# Patient Record
Sex: Female | Born: 1990 | Race: Black or African American | Hispanic: No | Marital: Single | Smoking: Former smoker
Health system: Southern US, Community
[De-identification: ages and names within clinical notes are randomized; demographics above are authoritative.]

---
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
## 2009-05-05 ENCOUNTER — Emergency Department: Payer: Self-pay | Admitting: Emergency Medicine

## 2010-07-29 ENCOUNTER — Emergency Department: Payer: Self-pay | Admitting: Unknown Physician Specialty

## 2010-08-02 ENCOUNTER — Emergency Department: Payer: Self-pay | Admitting: Emergency Medicine

## 2011-06-12 ENCOUNTER — Emergency Department: Payer: Self-pay | Admitting: Unknown Physician Specialty

## 2014-10-22 ENCOUNTER — Emergency Department
Admit: 2014-10-22 | Disposition: A | Discharge status: Home / Self Care | Payer: Self-pay | Admitting: Emergency Medicine

## 2015-10-09 ENCOUNTER — Encounter: Payer: Self-pay | Admitting: Emergency Medicine

## 2015-10-09 DIAGNOSIS — S51811A Laceration without foreign body of right forearm, initial encounter: Secondary | ICD-10-CM | POA: Insufficient documentation

## 2015-10-09 DIAGNOSIS — W260XXA Contact with knife, initial encounter: Secondary | ICD-10-CM | POA: Insufficient documentation

## 2015-10-09 DIAGNOSIS — Y998 Other external cause status: Secondary | ICD-10-CM | POA: Insufficient documentation

## 2015-10-09 DIAGNOSIS — Z87891 Personal history of nicotine dependence: Secondary | ICD-10-CM | POA: Insufficient documentation

## 2015-10-09 DIAGNOSIS — Z23 Encounter for immunization: Secondary | ICD-10-CM | POA: Insufficient documentation

## 2015-10-09 DIAGNOSIS — Y9389 Activity, other specified: Secondary | ICD-10-CM | POA: Insufficient documentation

## 2015-10-09 DIAGNOSIS — Y9289 Other specified places as the place of occurrence of the external cause: Secondary | ICD-10-CM | POA: Insufficient documentation

## 2015-10-09 NOTE — ED Notes (Signed)
Pt presents to ED with laceration to the top of her right arm. approx 6cm long with approximated edges. Bleeding not currently controlled; pressure dressing applied during triage. Pt states she was playing with a knife and accidentally cut her arm.

## 2015-10-09 NOTE — ED Notes (Signed)
Patient to stat desk via wheelchair.  Patient reports she was playing with a knife and it slipped down her arm.  Patient with large laceration to right forearm (approximately 4 inches).  Dressing applied, then immediately reinforced with more 4x4's.

## 2015-10-10 ENCOUNTER — Emergency Department
Admission: EM | Admit: 2015-10-10 | Discharge: 2015-10-10 | Disposition: A | Discharge status: Home / Self Care | Payer: Self-pay | Attending: Emergency Medicine | Admitting: Emergency Medicine

## 2015-10-10 DIAGNOSIS — S51811A Laceration without foreign body of right forearm, initial encounter: Secondary | ICD-10-CM

## 2015-10-10 MED ORDER — LIDOCAINE-EPINEPHRINE (PF) 1 %-1:200000 IJ SOLN
INTRAMUSCULAR | Status: AC
  Filled 2015-10-10: qty 30

## 2015-10-10 MED ORDER — LIDOCAINE HCL (PF) 1 % IJ SOLN: 30.0000 mL | Freq: Once | INTRAMUSCULAR | Status: DC

## 2015-10-10 MED ORDER — LIDOCAINE HCL (PF) 1 % IJ SOLN
INTRAMUSCULAR | Status: AC
  Filled 2015-10-10: qty 5

## 2015-10-10 MED ORDER — SODIUM BICARBONATE 8.4 % IV SOLN: 25.0000 meq | Freq: Once | INTRAVENOUS | Status: DC

## 2015-10-10 MED ORDER — CEFAZOLIN SODIUM-DEXTROSE 2-3 GM-% IV SOLR
2.0000 g | Freq: Once | INTRAVENOUS | Status: AC
  Administered 2015-10-10: 2 g via INTRAVENOUS
  Filled 2015-10-10: qty 50

## 2015-10-10 MED ORDER — BACITRACIN ZINC 500 UNIT/GM EX OINT
TOPICAL_OINTMENT | Freq: Two times a day (BID) | CUTANEOUS | Status: DC
  Administered 2015-10-10: 1 via TOPICAL

## 2015-10-10 MED ORDER — LIDOCAINE-EPINEPHRINE (PF) 2 %-1:200000 IJ SOLN: 20.0000 mL | Freq: Once | INTRAMUSCULAR | Status: DC

## 2015-10-10 MED ORDER — TETANUS-DIPHTH-ACELL PERTUSSIS 5-2.5-18.5 LF-MCG/0.5 IM SUSP
0.5000 mL | Freq: Once | INTRAMUSCULAR | Status: AC
  Administered 2015-10-10: 0.5 mL via INTRAMUSCULAR
  Filled 2015-10-10: qty 0.5

## 2015-10-10 MED ORDER — CEPHALEXIN 500 MG PO CAPS: 500.0000 mg | ORAL_CAPSULE | Freq: Four times a day (QID) | ORAL | Status: AC

## 2015-10-10 MED ORDER — BACITRACIN ZINC 500 UNIT/GM EX OINT
TOPICAL_OINTMENT | CUTANEOUS | Status: AC
  Filled 2015-10-10: qty 0.9

## 2015-10-10 NOTE — ED Provider Notes (Signed)
[facility] Emergency Department Provider Note  ____________________________________________   I have reviewed the triage vital signs and the nursing notes.   HISTORY  Chief Complaint Laceration    HPI Tami Waters is a 25 y.o. female states that she was "playing with" a razor knife and accidentally cut her arm. Witnesses with her state that this is what happened. Patient has no SI or HI and she adamantly denies doing this on purpose. She states that she was tossing the knife around and it went up her arm by accident. she adamantly denies assault. She denies any numbness or weakness.   History reviewed. No pertinent past medical history.  There are no active problems to display for this patient.   History reviewed. No pertinent past surgical history.  No current outpatient prescriptions on file.  Allergies Review of patient's allergies indicates no known allergies.  No family history on file.  Social History Social History  Substance Use Topics  . Smoking status: Former Games developermoker  . Smokeless tobacco: Never Used  . Alcohol Use: Yes    Review of Systems See history of present illness otherwise negative  ____________________________________________   PHYSICAL EXAM:  VITAL SIGNS: ED Triage Vitals  Enc Vitals Group     BP 10/09/15 2346 127/96 mmHg     Pulse Rate 10/09/15 2346 82     Resp 10/09/15 2346 16     Temp 10/09/15 2346 98.1 F (36.7 C)     Temp Source 10/09/15 2346 Oral     SpO2 10/09/15 2346 100 %     Weight 10/09/15 2346 295 lb (133.811 kg)     Height 10/09/15 2346 5\' 11"  (1.803 m)     Head Cir --      Peak Flow --      Pain Score 10/09/15 2346 8     Pain Loc --      Pain Edu? --      Excl. in GC? --     Constitutional: Alert and oriented. Well appearing and in no acute distress. Eyes: Conjunctivae are normal. PERRL. EOMI. Head: Atraumatic. Nose: No congestion/rhinnorhea. Mouth/Throat: Mucous  membranes are moist.  Oropharynx non-erythematous. Neck: No stridor.   Nontender with no meningismus Cardiovascular: Normal rate, regular rhythm. Grossly normal heart sounds.  Good peripheral circulation. Respiratory: Normal respiratory effort.  No retractions. Lungs CTAB. Abdominal: Soft and nontender. No distention. No guarding no rebound Back:  There is no focal tenderness or step off there is no midline tenderness there are no lesions noted. there is no CVA tenderness Musculoskeletal: No lower extremity tenderness. No joint effusions, no DVT signs strong distal pulses no edema, patient has a very deep long laceration, 16 cm to the dorsum of the left forearm from smoke to the wrist up towards the elbow. That does not involve any joint spaces. It does go deep into muscle. It is gaping open. There is a slow venous bleed noted. An sedation and strength intact in all nerve distributions of the hand radial ulnar and medial. Patient has intact flexion and extension of the wrist. she can flex and extend against resistance in all fingers in all distributions there is no foreign body noted or obvious tendon injury noted even when the wound is evaluated under all range of motion pains and fingers Neurologic:  Normal speech and language. No gross focal neurologic deficits are appreciated.  Skin:  See above Psychiatric: Mood and affect are normal. Speech and behavior are normal.  ____________________________________________   LABS (all labs ordered are listed, but only abnormal results are displayed)  Labs Reviewed - No data to display ____________________________________________  EKG  I personally interpreted any EKGs ordered by me or triage ____________________________________________  RADIOLOGY  I reviewed any imaging ordered by me or triage that were performed during my shift and, if possible, patient and/or family made aware of any abnormal  findings. ____________________________________________   PROCEDURES  Procedure(s) performed:  LACERATION REPAIR Performed by: Jeanmarie Plant Authorized by: Jeanmarie Plant Consent: Verbal consent obtained. Risks and benefits: risks, benefits and alternatives were discussed Consent given by: patient Patient identity confirmed: provided demographic data Prepped and Draped in normal sterile fashion Wound explored  Laceration Location: Right forearm  Laceration Length: 16 cm  No Foreign Bodies seen or palpated  Anesthesia: local infiltration  Local anesthetic: lidocaine 2% % with epinephrine, 10 cc diluted with sterile water as well as 5 cc of bicarbonate   Anesthetic total: 15 ml combined lidocaine and bicarbonate   Irrigation method: syringe Amount of cleaning: Significant 2 L under pressure the first with Betadine mixed in, the second sterile water   Skin closure: Deep sutures were used, 4 mattress sutures 0 gut to approximate the muscle, followed by 10 subcutaneous Rapide 4-0- sutures followed by 24 cutaneous interrupted and running sutures intermixed of 4-0 monofilament   Number of sutures: 34   Technique: See above,   Patient tolerance: Patient tolerated the procedure well with no immediate complications. No foreign bodies detected, no evidence of acute injury or laceration to tendons.   Critical Care performed: None  ____________________________________________   INITIAL IMPRESSION / ASSESSMENT AND PLAN / ED COURSE  Pertinent labs & imaging results that were available during my care of the patient were reviewed by me and considered in my medical decision making (see chart for details).  Patient with a very deep and, complicated laceration, this raises several issues. The first is causality. Patient adamantly denies any SI or HI and friends with her collaborate this. Patient denies any assault (with her collaborates this. She was showing off with a knife she  states and actually cut herself. She does not wish to talk to police. She feels safe at home. This was not a suicide attempt. Family and friends with her agreed to this evaluation. They state that there is no way she would try to hurt herself. Patient's affect is normal. I certainly cannot IVC this patient given the situation. I have expressed to her my concern about this injury and she states that she'll be much more careful in the future. The next issue was the injury itself. There is no evidence of acute tendon injury or foreign body. Nonetheless, I then extensively irrigated with 2 L of fluid under pressure, after numbing it up with lidocaine and bicarbonate. Sterile field obtained using chlorhexidine as well as Betadine. Then I did an extensive layered closure using 0 gut to bring the muscle together followed by 4-0 Rapide for subcutaneous closure followed by 4-0 interrupted and continuous sutures using monofilament to bring the wound together. Was able to use part of the tattoo to guide closure. Patient tolerated the procedure well. Giving her Ancef and we'll send her home antibiotics. Placing her in a splint. She'll follow-up with orthopedics. Return precautions and follow-up given and understood. Patient will follow up with orthopedic surgery for further evaluation of this deep cut and return precautions understood ____________________________________________   FINAL CLINICAL IMPRESSION(S) / ED DIAGNOSES  Final diagnoses:  None      This chart was dictated using voice recognition software.  Despite best efforts to proofread,  errors can occur which can change meaning.     Jeanmarie Plant, MD 10/10/15 [phone]

## 2015-10-10 NOTE — ED Notes (Signed)
MD at bedside. 

## 2015-10-10 NOTE — Discharge Instructions (Signed)
Laceration Care, Adult  A laceration is a cut that goes through all layers of the skin. The cut also goes into the tissue that is right under the skin. Some cuts heal on their own. Others need to be closed with stitches (sutures), staples, skin adhesive strips, or wound glue. Taking care of your cut lowers your risk of infection and helps your cut to heal better.  HOW TO TAKE CARE OF YOUR CUT  For stitches or staples:  · Keep the wound clean and dry.  · If you were given a bandage (dressing), you should change it at least one time per day or as told by your doctor. You should also change it if it gets wet or dirty.  · Keep the wound completely dry for the first 24 hours or as told by your doctor. After that time, you may take a shower or a bath. However, make sure that the wound is not soaked in water until after the stitches or staples have been removed.  · Clean the wound one time each day or as told by your doctor:    Wash the wound with soap and water.    Rinse the wound with water until all of the soap comes off.    Pat the wound dry with a clean towel. Do not rub the wound.  · After you clean the wound, put a thin layer of antibiotic ointment on it as told by your doctor. This ointment:    Helps to prevent infection.    Keeps the bandage from sticking to the wound.  · Have your stitches or staples removed as told by your doctor.  If your doctor used skin adhesive strips:   · Keep the wound clean and dry.  · If you were given a bandage, you should change it at least one time per day or as told by your doctor. You should also change it if it gets dirty or wet.  · Do not get the skin adhesive strips wet. You can take a shower or a bath, but be careful to keep the wound dry.  · If the wound gets wet, pat it dry with a clean towel. Do not rub the wound.  · Skin adhesive strips fall off on their own. You can trim the strips as the wound heals. Do not remove any strips that are still stuck to the wound. They will  fall off after a while.  If your doctor used wound glue:  · Try to keep your wound dry, but you may briefly wet it in the shower or bath. Do not soak the wound in water, such as by swimming.  · After you take a shower or a bath, gently pat the wound dry with a clean towel. Do not rub the wound.  · Do not do any activities that will make you really sweaty until the skin glue has fallen off on its own.  · Do not apply liquid, cream, or ointment medicine to your wound while the skin glue is still on.  · If you were given a bandage, you should change it at least one time per day or as told by your doctor. You should also change it if it gets dirty or wet.  · If a bandage is placed over the wound, do not let the tape for the bandage touch the skin glue.  · Do not pick at the glue. The skin glue usually stays on for 5-10 days. Then, it   falls off of the skin.  General Instructions   · To help prevent scarring, make sure to cover your wound with sunscreen whenever you are outside after stitches are removed, after adhesive strips are removed, or when wound glue stays in place and the wound is healed. Make sure to wear a sunscreen of at least 30 SPF.  · Take over-the-counter and prescription medicines only as told by your doctor.  · If you were given antibiotic medicine or ointment, take or apply it as told by your doctor. Do not stop using the antibiotic even if your wound is getting better.  · Do not scratch or pick at the wound.  · Keep all follow-up visits as told by your doctor. This is important.  · Check your wound every day for signs of infection. Watch for:    Redness, swelling, or pain.    Fluid, blood, or pus.  · Raise (elevate) the injured area above the level of your heart while you are sitting or lying down, if possible.  GET HELP IF:  · You got a tetanus shot and you have any of these problems at the injection site:    Swelling.    Very bad pain.    Redness.    Bleeding.  · You have a fever.  · A wound that was  closed breaks open.  · You notice a bad smell coming from your wound or your bandage.  · You notice something coming out of the wound, such as wood or glass.  · Medicine does not help your pain.  · You have more redness, swelling, or pain at the site of your wound.  · You have fluid, blood, or pus coming from your wound.  · You notice a change in the color of your skin near your wound.  · You need to change the bandage often because fluid, blood, or pus is coming from the wound.  · You start to have a new rash.  · You start to have numbness around the wound.  GET HELP RIGHT AWAY IF:  · You have very bad swelling around the wound.  · Your pain suddenly gets worse and is very bad.  · You notice painful lumps near the wound or on skin that is anywhere on your body.  · You have a red streak going away from your wound.  · The wound is on your hand or foot and you cannot move a finger or toe like you usually can.  · The wound is on your hand or foot and you notice that your fingers or toes look pale or bluish.     This information is not intended to replace advice given to you by your health care provider. Make sure you discuss any questions you have with your health care provider.     Document Released: 12/27/2007 Document Revised: 11/24/2014 Document Reviewed: 07/06/2014  Elsevier Interactive Patient Education ©2016 [employer]

## 2015-10-10 NOTE — ED Notes (Signed)
MD at bedside suturing nearly completed suturing patient

## 2015-10-13 ENCOUNTER — Emergency Department
Admission: EM | Admit: 2015-10-13 | Discharge: 2015-10-13 | Disposition: A | Discharge status: Home / Self Care | Payer: Self-pay | Attending: Emergency Medicine | Admitting: Emergency Medicine

## 2015-10-13 ENCOUNTER — Encounter: Payer: Self-pay | Admitting: Emergency Medicine

## 2015-10-13 DIAGNOSIS — Z792 Long term (current) use of antibiotics: Secondary | ICD-10-CM | POA: Insufficient documentation

## 2015-10-13 DIAGNOSIS — R209 Unspecified disturbances of skin sensation: Secondary | ICD-10-CM | POA: Insufficient documentation

## 2015-10-13 DIAGNOSIS — Z4800 Encounter for change or removal of nonsurgical wound dressing: Secondary | ICD-10-CM | POA: Insufficient documentation

## 2015-10-13 DIAGNOSIS — R52 Pain, unspecified: Secondary | ICD-10-CM | POA: Insufficient documentation

## 2015-10-13 DIAGNOSIS — R202 Paresthesia of skin: Secondary | ICD-10-CM

## 2015-10-13 DIAGNOSIS — Z87891 Personal history of nicotine dependence: Secondary | ICD-10-CM | POA: Insufficient documentation

## 2015-10-13 DIAGNOSIS — IMO0002 Reserved for concepts with insufficient information to code with codable children: Secondary | ICD-10-CM

## 2015-10-13 MED ORDER — NAPROXEN 500 MG PO TABS: 500.0000 mg | ORAL_TABLET | Freq: Two times a day (BID) | ORAL | Status: DC

## 2015-10-13 MED ORDER — TRAMADOL HCL 50 MG PO TABS
50.0000 mg | ORAL_TABLET | Freq: Four times a day (QID) | ORAL | Status: DC | PRN
Start: 2015-10-13 — End: 2016-01-15

## 2015-10-13 NOTE — ED Notes (Signed)
Pt seen on Saturday post laceration to right forearm, 34 stitches placed. Pt states she noticed swelling to right hand and tingling in the right pinky and ring finger. Pt was told on Saturday that if she noticed swelling to the affected area to report back to the ED.

## 2015-10-13 NOTE — ED Provider Notes (Signed)
[facility] Emergency Department Provider Note ____________________________________________  Time seen: Approximately 7:53 PM  I have reviewed the triage vital signs and the nursing notes.   HISTORY  Chief Complaint Wound Check   HPI Tami Waters is a 25 y.o. female who presents to the emergency department for evaluation of a laceration on her right forearm that was sutured here on 10/10/2015. She states that yesterday she began having a burning tingling sensation in the dorsal aspect of the ring and pinky finger on that right hand. She also noticed at that time some mild swelling of the hand. She has been taking the Keflex as prescribed. She has also been taking Tylenol for pain and she feels that this is insufficient and requests a prescription for something stronger.  History reviewed. No pertinent past medical history.  There are no active problems to display for this patient.   History reviewed. No pertinent past surgical history.  Current Outpatient Rx  Name  Route  Sig  Dispense  Refill  . cephALEXin (KEFLEX) 500 MG capsule   Oral   Take 1 capsule (500 mg total) by mouth 4 (four) times daily.   40 capsule   0   . naproxen (NAPROSYN) 500 MG tablet   Oral   Take 1 tablet (500 mg total) by mouth 2 (two) times daily with a meal.   60 tablet   0   . traMADol (ULTRAM) 50 MG tablet   Oral   Take 1 tablet (50 mg total) by mouth every 6 (six) hours as needed.   12 tablet   0     Allergies Review of patient's allergies indicates no known allergies.  No family history on file.  Social History Social History  Substance Use Topics  . Smoking status: Former Games developermoker  . Smokeless tobacco: Never Used  . Alcohol Use: Yes    Review of Systems Constitutional: No fever/chills  Musculoskeletal:Right forearm positive for tenderness due to laceration. Skin: Positive for sutured laceration on the right dorsal aspect of the  forearm. Neurological: Negative for headaches, focal weakness or numbness.   ____________________________________________   PHYSICAL EXAM:  VITAL SIGNS: ED Triage Vitals  Enc Vitals Group     BP 10/13/15 1836 125/92 mmHg     Pulse Rate 10/13/15 1836 87     Resp 10/13/15 1836 18     Temp 10/13/15 1836 98 F (36.7 C)     Temp Source 10/13/15 1836 Oral     SpO2 10/13/15 1836 100 %     Weight 10/13/15 1836 295 lb (133.811 kg)     Height 10/13/15 1836 5\' 11"  (1.803 m)     Head Cir --      Peak Flow --      Pain Score 10/13/15 1838 9     Pain Loc --      Pain Edu? --      Excl. in GC? --     Constitutional: Alert and oriented. Well appearing and in no acute distress. Eyes: Conjunctivae are normal. Head: Atraumatic. Cardiovascular:Good peripheral circulation. Respiratory: Normal respiratory effort.  No retractions.  Musculoskeletal: Full ROM throughout. Neurologic:  Normal speech and language. No gross focal neurologic deficits are appreciated. No sensation discrepancy between sharp and dull of the ring and pinky finger of the right hand. Patient is able to touch thumb to each finger on the right hand without difficulty or pain. Speech is normal. No gait instability. Skin: Well approximated laceration noted to the  dorsal aspect of the right forearm that appears to be healing as expected without erythema or drainage.   Psychiatric: Mood and affect are normal. Speech and behavior are normal.  ____________________________________________   LABS (all labs ordered are listed, but only abnormal results are displayed)  Labs Reviewed - No data to display ____________________________________________  RADIOLOGY  Not indicated ____________________________________________   PROCEDURES  Procedure(s) performed: Not indicated  ___________________________________________   INITIAL IMPRESSION / ASSESSMENT AND PLAN / ED COURSE  Pertinent labs & imaging results that were available  during my care of the patient were reviewed by me and considered in my medical decision making (see chart for details).  Patient was advised to follow up with urgent care or primary care as previously discussed when sutures were inserted. She was advised that she will need to follow-up with a hand specialist or orthopedics if paresthesias continue. She was advised to return to the emergency department for symptoms that change or worsen if she is unable to schedule an appointment. ____________________________________________   FINAL CLINICAL IMPRESSION(S) / ED DIAGNOSES  Final diagnoses:  Encounter for re-check of laceration wound  Paresthesias in right hand  Pain from laceration       Chinita Pester, FNP 10/13/15 1610  Maurilio Lovely, MD 10/14/15 [phone]

## 2015-10-13 NOTE — ED Notes (Signed)
Pt got sutured here on 10/10/15 on her right arm, states pain and tingling in her fingers began last pm.

## 2016-01-14 ENCOUNTER — Other Ambulatory Visit: Payer: Self-pay

## 2016-01-14 ENCOUNTER — Emergency Department: Payer: Self-pay

## 2016-01-14 DIAGNOSIS — R0789 Other chest pain: Secondary | ICD-10-CM | POA: Insufficient documentation

## 2016-01-14 DIAGNOSIS — Z5321 Procedure and treatment not carried out due to patient leaving prior to being seen by health care provider: Secondary | ICD-10-CM | POA: Insufficient documentation

## 2016-01-14 NOTE — ED Notes (Signed)
Patient reports mid upper chest tightness and feeling short of breath and upper back pain.  Patient also reports she felt dizzy.

## 2016-01-15 ENCOUNTER — Emergency Department
Admission: EM | Admit: 2016-01-15 | Discharge: 2016-01-15 | Disposition: A | Discharge status: Home / Self Care | Payer: Self-pay | Attending: Emergency Medicine | Admitting: Emergency Medicine

## 2016-01-15 LAB — CBC
HEMATOCRIT: 36.4 % (ref 35.0–47.0)
HEMOGLOBIN: 12.1 g/dL (ref 12.0–16.0)
MCH: 25.7 pg — ABNORMAL LOW (ref 26.0–34.0)
MCHC: 33.2 g/dL (ref 32.0–36.0)
MCV: 77.4 fL — AB (ref 80.0–100.0)
Platelets: 223 10*3/uL (ref 150–440)
RBC: 4.71 MIL/uL (ref 3.80–5.20)
RDW: 15.3 % — ABNORMAL HIGH (ref 11.5–14.5)
WBC: 10 10*3/uL (ref 3.6–11.0)

## 2016-01-15 LAB — BASIC METABOLIC PANEL
Anion gap: 7 (ref 5–15)
BUN: 12 mg/dL (ref 6–20)
CHLORIDE: 108 mmol/L (ref 101–111)
CO2: 24 mmol/L (ref 22–32)
Calcium: 8.9 mg/dL (ref 8.9–10.3)
Creatinine, Ser: 0.92 mg/dL (ref 0.44–1.00)
GFR calc non Af Amer: >60 mL/min (ref >60)
Glucose, Bld: 111 mg/dL — ABNORMAL HIGH (ref 65–99)
POTASSIUM: 3.3 mmol/L — AB (ref 3.5–5.1)
SODIUM: 139 mmol/L (ref 135–145)

## 2016-01-15 LAB — TROPONIN I: Troponin I: <0.03 ng/mL (ref <0.031)

## 2017-06-11 ENCOUNTER — Other Ambulatory Visit: Payer: Self-pay

## 2017-06-11 ENCOUNTER — Emergency Department: Payer: Self-pay

## 2017-06-11 ENCOUNTER — Emergency Department
Admission: EM | Admit: 2017-06-11 | Discharge: 2017-06-11 | Disposition: A | Discharge status: Home / Self Care | Payer: Self-pay | Attending: Emergency Medicine | Admitting: Emergency Medicine

## 2017-06-11 DIAGNOSIS — Y9301 Activity, walking, marching and hiking: Secondary | ICD-10-CM | POA: Insufficient documentation

## 2017-06-11 DIAGNOSIS — Y999 Unspecified external cause status: Secondary | ICD-10-CM | POA: Insufficient documentation

## 2017-06-11 DIAGNOSIS — W2201XA Walked into wall, initial encounter: Secondary | ICD-10-CM | POA: Insufficient documentation

## 2017-06-11 DIAGNOSIS — Z87891 Personal history of nicotine dependence: Secondary | ICD-10-CM | POA: Insufficient documentation

## 2017-06-11 DIAGNOSIS — S90121A Contusion of right lesser toe(s) without damage to nail, initial encounter: Secondary | ICD-10-CM | POA: Insufficient documentation

## 2017-06-11 DIAGNOSIS — Y929 Unspecified place or not applicable: Secondary | ICD-10-CM | POA: Insufficient documentation

## 2017-06-11 MED ORDER — IBUPROFEN 800 MG PO TABS
800.0000 mg | ORAL_TABLET | Freq: Once | ORAL | Status: AC
  Administered 2017-06-11: 800 mg via ORAL
  Filled 2017-06-11: qty 1

## 2017-06-11 MED ORDER — IBUPROFEN 800 MG PO TABS: 800.0000 mg | ORAL_TABLET | Freq: Three times a day (TID) | ORAL | 0 refills | Status: DC | PRN

## 2017-06-11 NOTE — ED Triage Notes (Addendum)
Pt presents to ED via POV with c/o RIGHT foot pain. Pt reports kicking the wall on accident while running through the house playing with some kids. Pt has redness and swelling to foot; no obvious deformity or dislocation; CMS intact.

## 2017-06-11 NOTE — ED Provider Notes (Signed)
[facility] Emergency Department Provider Note ____________________________________________  Time seen: 2237  I have reviewed the triage vital signs and the nursing notes.  HISTORY  Chief Complaint  Foot Pain  HPI Tami Waters is a 26 y.o. female presented herself to the ED for evaluation of pain to the right foot.  She describes pain primarily to the right fourth toes.  She describes accidentally kicking a wall, related to house barefoot.  She denies any head injury, loss of consciousness, with him at this time but she presents with some pain and mild redness to the right fourth toe.  No nail injury or deformity is reported.  History reviewed. No pertinent past medical history.  There are no active problems to display for this patient.  History reviewed. No pertinent surgical history.  Prior to Admission medications   Medication Sig Start Date End Date Taking? Authorizing Provider  ibuprofen (ADVIL,MOTRIN) 800 MG tablet Take 1 tablet (800 mg total) every 8 (eight) hours as needed by mouth. 06/11/17   Raymel Cull, Charlesetta IvoryJenise V Bacon, PA-C    Allergies Patient has no known allergies.  No family history on file.  Social History Social History   Tobacco Use  . Smoking status: Former Games developermoker  . Smokeless tobacco: Never Used  Substance Use Topics  . Alcohol use: Yes  . Drug use: No    Review of Systems  Constitutional: Negative for fever.  Cardiovascular: Negative for chest pain. Respiratory: Negative for shortness of breath. Musculoskeletal: Negative for back pain. Right toe pain as above Skin: Negative for rash. Neurological: Negative for headaches, focal weakness or numbness. ____________________________________________  PHYSICAL EXAM:  VITAL SIGNS: ED Triage Vitals  Enc Vitals Group     BP 06/11/17 2138 129/67     Pulse Rate 06/11/17 2138 86     Resp 06/11/17 2138 16     Temp 06/11/17 2138 98.5 F (36.9 C)     Temp Source 06/11/17  2138 Oral     SpO2 06/11/17 2138 99 %     Weight 06/11/17 2136 256 lb (116.1 kg)     Height 06/11/17 2136 5\' 10"  (1.778 m)     Head Circumference --      Peak Flow --      Pain Score 06/11/17 2136 10     Pain Loc --      Pain Edu? --      Excl. in GC? --     Constitutional: Alert and oriented. Well appearing and in no distress. Head: Normocephalic and atraumatic. Cardiovascular:  Normal distal pulses and cap refill. Respiratory: Normal respiratory effort. No wheezes/rales/rhonchi. Musculoskeletal: Right foot without deformity, dislocation or edema. Mild ecchymosis noted to the right 4th toe DIP. Normal toe ROM. Nontender with normal range of motion in all extremities.  Neurologic:  Antalgic gait without ataxia. Normal speech and language. No gross focal neurologic deficits are appreciated. Skin:  Skin is warm, dry and intact. No rash noted. ____________________________________________   RADIOLOGY  Right Foot IMPRESSION: Negative.  I, Isamar Wellbrock, Charlesetta IvoryJenise V Bacon, personally viewed and evaluated these images (plain radiographs) as part of my medical decision making, as well as reviewing the written report by the radiologist. ____________________________________________  PROCEDURES  Procedures IBU 800 mg PO Post-op shoe ____________________________________________  INITIAL IMPRESSION / ASSESSMENT AND PLAN / ED COURSE  ED evaluation of acute pain to the right foot specifically the right toe.  X-rays negative for any acute fracture dislocation.  She is placed in a postop shoe  after the toes are buddy taped.  A prescription for ibuprofen is provided and she is referred to podiatry for ongoing symptoms. A work note is provided for tomorrow is provided as requested. ____________________________________________  FINAL CLINICAL IMPRESSION(S) / ED DIAGNOSES  Final diagnoses:  Contusion of fourth toe of right foot, initial encounter      Lissa HoardMenshew, Anuhea Gassner V Bacon, PA-C 06/11/17  2343    Sharman CheekStafford, Phillip, MD 06/12/17 [phone]

## 2017-06-11 NOTE — Discharge Instructions (Signed)
You have a contusion to the 4th toe. Wear the buddy tape as needed. Apply ice to reduce pain and swelling. Follow-up with Dr. Ether GriffinsFowler as needed.

## 2017-06-11 NOTE — ED Notes (Signed)
Pt ambulatory upon discharge. Verbalized understanding of discharge instructions, prescription and follow-up care. Pt A&O x4. Skin warm and dry.

## 2017-06-11 NOTE — ED Notes (Signed)
Pt states she was playing with the kids when she accidentally ran into the wall. Happened a few hours ago but she states it's getting worse.

## 2017-11-03 ENCOUNTER — Emergency Department (HOSPITAL_COMMUNITY)
Admission: EM | Admit: 2017-11-03 | Discharge: 2017-11-03 | Disposition: A | Discharge status: Home / Self Care | Payer: Self-pay | Attending: Emergency Medicine | Admitting: Emergency Medicine

## 2017-11-03 ENCOUNTER — Other Ambulatory Visit: Payer: Self-pay

## 2017-11-03 ENCOUNTER — Encounter (HOSPITAL_COMMUNITY): Payer: Self-pay | Admitting: Emergency Medicine

## 2017-11-03 ENCOUNTER — Emergency Department (HOSPITAL_COMMUNITY): Payer: Self-pay

## 2017-11-03 DIAGNOSIS — Z87891 Personal history of nicotine dependence: Secondary | ICD-10-CM | POA: Insufficient documentation

## 2017-11-03 DIAGNOSIS — Y999 Unspecified external cause status: Secondary | ICD-10-CM | POA: Insufficient documentation

## 2017-11-03 DIAGNOSIS — Y92009 Unspecified place in unspecified non-institutional (private) residence as the place of occurrence of the external cause: Secondary | ICD-10-CM | POA: Insufficient documentation

## 2017-11-03 DIAGNOSIS — Y93E5 Activity, floor mopping and cleaning: Secondary | ICD-10-CM | POA: Insufficient documentation

## 2017-11-03 DIAGNOSIS — W260XXA Contact with knife, initial encounter: Secondary | ICD-10-CM | POA: Insufficient documentation

## 2017-11-03 DIAGNOSIS — S61239A Puncture wound without foreign body of unspecified finger without damage to nail, initial encounter: Secondary | ICD-10-CM

## 2017-11-03 DIAGNOSIS — S61031A Puncture wound without foreign body of right thumb without damage to nail, initial encounter: Secondary | ICD-10-CM | POA: Insufficient documentation

## 2017-11-03 MED ORDER — CEPHALEXIN 500 MG PO CAPS: 500.0000 mg | ORAL_CAPSULE | Freq: Four times a day (QID) | ORAL | 0 refills | Status: DC

## 2017-11-03 NOTE — ED Provider Notes (Signed)
[facility] EMERGENCY DEPARTMENT Provider Note   CSN: [id number] Arrival date & time: 11/03/17  2056     History   Chief Complaint Chief Complaint  Patient presents with  . Puncture    HPI Tami Waters is a 27 y.o. female.  Tami Waters is a 27 y.o. Female who is otherwise healthy, presents to the ED for evaluation of laceration to the base of the right thumb just prior to arrival.  Patient reports she was cleaning her hair off of her vacuum with an X-Acto knife and it slipped and she stabbed down at the base of her right thumb.  Patient reports some bleeding.  She wash the area out with water and hydrogen peroxide prior to arrival.  Patient reports pain and limited range of motion, no erythema or swelling of the joint.  No numbness or tingling.  Tetanus updated 2 years ago.     History reviewed. No pertinent past medical history.  There are no active problems to display for this patient.   History reviewed. No pertinent surgical history.   OB History   None      Home Medications    Prior to Admission medications   Medication Sig Start Date End Date Taking? Authorizing Provider  ibuprofen (ADVIL,MOTRIN) 800 MG tablet Take 1 tablet (800 mg total) every 8 (eight) hours as needed by mouth. 06/11/17   Menshew, Charlesetta Ivory, PA-C    Family History No family history on file.  Social History Social History   Tobacco Use  . Smoking status: Former Games developer  . Smokeless tobacco: Never Used  Substance Use Topics  . Alcohol use: Yes  . Drug use: No     Allergies   Patient has no known allergies.   Review of Systems Review of Systems  Constitutional: Negative for chills and unexpected weight change.  Musculoskeletal: Positive for arthralgias.  Skin: Positive for wound.  Neurological: Negative for weakness and numbness.     Physical Exam Updated Vital Signs BP (!) 130/91 (BP Location: Right Arm)   Pulse 80   Temp 97.8  F (36.6 C) (Oral)   Resp 20   Ht 5\' 11"  (1.803 m)   Wt 116.1 kg (256 lb)   LMP 10/22/2017   SpO2 100%   BMI 35.70 kg/m   Physical Exam  Constitutional: She appears well-developed and well-nourished. No distress.  HENT:  Head: Normocephalic and atraumatic.  Eyes: Right eye exhibits no discharge. Left eye exhibits no discharge.  Pulmonary/Chest: Effort normal. No respiratory distress.  Musculoskeletal:       Hands: 0.5 cm stab laceration at the base of the right thumb, small amount of bleeding present.  Range of motion limited by pain but patient is able to move the thumb in all directions.  No overlying erythema, warmth or swelling of the MCP joint.  2+ radial pulse and good capillary refill of the thumb, sensation intact.  Neurological: She is alert. Coordination normal.  Skin: Skin is warm and dry. She is not diaphoretic.  Psychiatric: She has a normal mood and affect. Her behavior is normal.  Nursing note and vitals reviewed.    ED Treatments / Results  Labs (all labs ordered are listed, but only abnormal results are displayed) Labs Reviewed - No data to display  EKG None  Radiology Dg Finger Thumb Right  Result Date: 11/03/2017 CLINICAL DATA:  Lateral thumb laceration. EXAM: RIGHT THUMB 2+V COMPARISON:  None. FINDINGS: There is no evidence of  fracture or dislocation. There is no evidence of arthropathy or other focal bone abnormality. Soft tissues are unremarkable IMPRESSION: Negative. Electronically Signed   By: Awilda Metroourtnay  Bloomer M.D.   On: 11/03/2017 22:11    Procedures Procedures (including critical care time)  Medications Ordered in ED Medications - No data to display   Initial Impression / Assessment and Plan / ED Course  I have reviewed the triage vital signs and the nursing notes.  Pertinent labs & imaging results that were available during my care of the patient were reviewed by me and considered in my medical decision making (see chart for  details).  Patient presents to the ED for evaluation of 0.5 cm stab laceration at the base of the right thumb.  Tetanus is up-to-date.  Patient reports increased pain with range of motion, bleeding is controlled.  Thumb is neurovascularly intact.  X-ray shows no evidence of fracture, arthropathy, or bony abnormality.  It is very unlikely that patient stabbed directly into the joint space, but as a precaution the area was irrigated with normal saline, dressed with bacitracin and adhesive bandage, and patient will be placed on Keflex for infection prophylaxis.  Will not closed wound with stitches as it is well approximated and very small.  Strict return precautions discussed.  Patient requests his brace for support, patient placed in Velcro thumb spica, but counseled that she will need to remove the brace at least twice a day and assess the skin for any signs of infection.  Patient expresses understanding and is in agreement with plan.  Final Clinical Impressions(s) / ED Diagnoses   Final diagnoses:  Puncture wound of finger of right hand, initial encounter    ED Discharge Orders        Ordered    cephALEXin (KEFLEX) 500 MG capsule  4 times daily     11/03/17 2315       Dartha LodgeFord, Daegon Deiss N, PA-C 11/04/17 [id number]    Shaune PollackIsaacs, Cameron, MD 11/04/17 1110

## 2017-11-03 NOTE — ED Triage Notes (Signed)
Pt states while working on a vacuum cleaner she accidentally stabbed herself with and exacto knife in R MP Joint, minimal drainage noted, pt experiencing pain and limited ROM. Small laceration noted

## 2017-11-03 NOTE — Discharge Instructions (Signed)
Please take antibiotics as directed.  Keep area clean and dry you may shower but do not submerge her hand under water, use antibiotic ointment and bandages.  Please follow-up with the [facility].  If you have increasing drainage from the wound, redness, swelling, warmth or any other new or concerning symptoms return to the ED for reevaluation.

## 2018-02-19 ENCOUNTER — Emergency Department
Admission: EM | Admit: 2018-02-19 | Discharge: 2018-02-19 | Disposition: A | Discharge status: Home / Self Care | Payer: Self-pay | Attending: Emergency Medicine | Admitting: Emergency Medicine

## 2018-02-19 ENCOUNTER — Other Ambulatory Visit: Payer: Self-pay

## 2018-02-19 DIAGNOSIS — Z87891 Personal history of nicotine dependence: Secondary | ICD-10-CM | POA: Insufficient documentation

## 2018-02-19 DIAGNOSIS — H1089 Other conjunctivitis: Secondary | ICD-10-CM | POA: Insufficient documentation

## 2018-02-19 DIAGNOSIS — H109 Unspecified conjunctivitis: Secondary | ICD-10-CM

## 2018-02-19 MED ORDER — CIPROFLOXACIN HCL 0.3 % OP SOLN
OPHTHALMIC | Status: AC
  Filled 2018-02-19: qty 2.5

## 2018-02-19 MED ORDER — CIPROFLOXACIN HCL 0.3 % OP SOLN
2.0000 [drp] | OPHTHALMIC | Status: DC
  Administered 2018-02-19: 2 [drp] via OPHTHALMIC
  Filled 2018-02-19: qty 2.5

## 2018-02-19 NOTE — ED Notes (Signed)
Pt c/o redness and drainage to her right eye for the past 2-3 days.

## 2018-02-19 NOTE — ED Triage Notes (Signed)
Pt arrives to ED via POV from home with c/o conjunctivitis to the right eye x3 days. Pt reports clear/yellow drainage.

## 2018-02-19 NOTE — ED Provider Notes (Signed)
[facility] Emergency Department Provider Note   First MD Initiated Contact with Patient 02/19/18 0134     (approximate)  I have reviewed the triage vital signs and the nursing notes.   HISTORY  Chief Complaint Conjunctivitis    HPI Tami Waters is a 27 y.o. female presents with 3-day history of right eye redness yellowish drainage, eyelash matting on awakening.  Patient denies any fever no cough.  Patient any change in vision.  Past medical history None she There are no active problems to display for this patient.   Surgical history None  Prior to Admission medications   Medication Sig Start Date End Date Taking? Authorizing Provider  cephALEXin (KEFLEX) 500 MG capsule Take 1 capsule (500 mg total) by mouth 4 (four) times daily. 11/03/17   Dartha Lodge, PA-C  ibuprofen (ADVIL,MOTRIN) 800 MG tablet Take 1 tablet (800 mg total) every 8 (eight) hours as needed by mouth. 06/11/17   Menshew, Charlesetta Ivory, PA-C    Allergies No known drug allergies No family history on file.  Social History Social History   Tobacco Use  . Smoking status: Former Games developer  . Smokeless tobacco: Never Used  Substance Use Topics  . Alcohol use: Yes  . Drug use: No    Review of Systems Constitutional: No fever/chills Eyes: No visual changes.  Positive for right eye redness and drainage ENT: No sore throat. Cardiovascular: Denies chest pain. Respiratory: Denies shortness of breath. Gastrointestinal: No abdominal pain.  No nausea, no vomiting.  No diarrhea.  No constipation. Genitourinary: Negative for dysuria. Musculoskeletal: Negative for neck pain.  Negative for back pain. Integumentary: Negative for rash. Neurological: Negative for headaches, focal weakness or numbness.  ____________________________________________   PHYSICAL EXAM:  VITAL SIGNS: ED Triage Vitals  Enc Vitals Group     BP 02/19/18 0059 118/78     Pulse Rate 02/19/18 0059 83       Resp 02/19/18 0059 17     Temp 02/19/18 0059 98.3 F (36.8 C)     Temp Source 02/19/18 0059 Oral     SpO2 02/19/18 0059 99 %     Weight 02/19/18 0057 124.7 kg (275 lb)     Height 02/19/18 0057 1.803 m (5\' 11" )     Head Circumference --      Peak Flow --      Pain Score 02/19/18 0057 8     Pain Loc --      Pain Edu? --      Excl. in GC? --     Constitutional: Alert and oriented. Well appearing and in no acute distress. Eyes: Conjunctivae are erythema with yellowish exudate.   Head: Atraumatic. Mouth/Throat: Mucous membranes are moist.  Oropharynx non-erythematous. Neck: No stridor.   Neurologic:  Normal speech and language. No gross focal neurologic deficits are appreciated.  Skin:  Skin is warm, dry and intact. No rash noted. Psychiatric: Mood and affect are normal. Speech and behavior are normal.        Procedures   ____________________________________________   INITIAL IMPRESSION / ASSESSMENT AND PLAN / ED COURSE  As part of my medical decision making, I reviewed the following data within the electronic MEDICAL RECORD NUMBER   28 year old female presented with above-stated history and physical exam concerning for bacterial conjunctivitis.  As such patient given Cipro ophthalmic will be prescribed the same for home. ____________________________________________  FINAL CLINICAL IMPRESSION(S) / ED DIAGNOSES  Final diagnoses:  Bacterial conjunctivitis of right eye  MEDICATIONS GIVEN DURING THIS VISIT:  Medications  ciprofloxacin (CILOXAN) 0.3 % ophthalmic solution 2 drop (has no administration in time range)     ED Discharge Orders    None       Note:  This document was prepared using Dragon voice recognition software and may include unintentional dictation errors.    Darci CurrentBrown, [place] N, MD 02/19/18 [phone]

## 2018-04-03 ENCOUNTER — Other Ambulatory Visit: Payer: Self-pay

## 2018-04-03 ENCOUNTER — Encounter: Payer: Self-pay | Admitting: Emergency Medicine

## 2018-04-03 DIAGNOSIS — N764 Abscess of vulva: Secondary | ICD-10-CM | POA: Insufficient documentation

## 2018-04-03 DIAGNOSIS — Z87891 Personal history of nicotine dependence: Secondary | ICD-10-CM | POA: Insufficient documentation

## 2018-04-03 NOTE — ED Triage Notes (Signed)
Patient ambulatory to triage with steady gait, without difficulty or distress noted; pt reports noted abscess to labia 2-3 days ago, tried to pop it and now having swelling/pain

## 2018-04-04 ENCOUNTER — Emergency Department
Admission: EM | Admit: 2018-04-04 | Discharge: 2018-04-04 | Disposition: A | Discharge status: Home / Self Care | Payer: Self-pay | Attending: Emergency Medicine | Admitting: Emergency Medicine

## 2018-04-04 DIAGNOSIS — N764 Abscess of vulva: Secondary | ICD-10-CM

## 2018-04-04 MED ORDER — LIDOCAINE-EPINEPHRINE 1 %-1:100000 IJ SOLN
20.0000 mL | Freq: Once | INTRAMUSCULAR | Status: DC
  Filled 2018-04-04: qty 20

## 2018-04-04 MED ORDER — HYDROCODONE-ACETAMINOPHEN 5-325 MG PO TABS: 1.0000 | ORAL_TABLET | Freq: Four times a day (QID) | ORAL | 0 refills | Status: DC | PRN

## 2018-04-04 MED ORDER — MIDAZOLAM HCL 5 MG/5ML IJ SOLN
5.0000 mg | Freq: Once | INTRAMUSCULAR | Status: AC
  Administered 2018-04-04: 5 mg via NASAL
  Filled 2018-04-04: qty 5

## 2018-04-04 NOTE — ED Notes (Signed)
Downtime just ended. Paper charted on pt.

## 2018-04-04 NOTE — Discharge Instructions (Signed)
Please perform sitz baths 2-3 times a day for the next week until this is fully healed.  Make sure you follow-up with your OB gynecologist within 3 days for recheck and return to the emergency department sooner for any concerns whatsoever.  It was a pleasure to take care of you today, and thank you for coming to our emergency department.  If you have any questions or concerns before leaving please ask the nurse to grab me and I'm more than happy to go through your aftercare instructions again.  If you were prescribed any opioid pain medication today such as Norco, Vicodin, Percocet, morphine, hydrocodone, or oxycodone please make sure you do not drive when you are taking this medication as it can alter your ability to drive safely.  If you have any concerns once you are home that you are not improving or are in fact getting worse before you can make it to your follow-up appointment, please do not hesitate to call 911 and come back for further evaluation.  Merrily BrittleNeil Wanda Cellucci, MD

## 2018-04-04 NOTE — ED Provider Notes (Signed)
[facility] Emergency Department Provider Note  ____________________________________________   First MD Initiated Contact with Patient 04/04/18 0004     (approximate)  I have reviewed the triage vital signs and the nursing notes.   HISTORY  Chief Complaint Abscess   HPI Tami Waters is a 27 y.o. female self presents to the emergency department with several days of painful swelling to her  vagina.  She has no history of the same.  Pain is been gradual onset slowly progressive is now moderate to severe.  Pain is worse with movement or walking and somewhat improved with rest.  She denies vaginal discharge.  History reviewed. No pertinent past medical history.  There are no active problems to display for this patient.   History reviewed. No pertinent surgical history.  Prior to Admission medications   Medication Sig Start Date End Date Taking? Authorizing Provider  cephALEXin (KEFLEX) 500 MG capsule Take 1 capsule (500 mg total) by mouth 4 (four) times daily. 11/03/17   Dartha LodgeFord, Kelsey N, PA-C  HYDROcodone-acetaminophen (NORCO) 5-325 MG tablet Take 1 tablet by mouth every 6 (six) hours as needed for up to 7 doses for severe pain. 04/04/18   Merrily Brittleifenbark, Trai Ells, MD  ibuprofen (ADVIL,MOTRIN) 800 MG tablet Take 1 tablet (800 mg total) every 8 (eight) hours as needed by mouth. 06/11/17   Menshew, Charlesetta IvoryJenise V Bacon, PA-C    Allergies Patient has no known allergies.  No family history on file.  Social History Social History   Tobacco Use  . Smoking status: Former Games developermoker  . Smokeless tobacco: Never Used  Substance Use Topics  . Alcohol use: Yes  . Drug use: No    Review of Systems Constitutional: No fever/chills Cardiovascular: Denies chest pain. Respiratory: Denies shortness of breath. Gastrointestinal: No abdominal pain.  No nausea, no vomiting.   GU: Positive for vaginal pain Neurological: Negative for  headaches   ____________________________________________   PHYSICAL EXAM:  VITAL SIGNS: ED Triage Vitals  Enc Vitals Group     BP 04/03/18 2336 132/90     Pulse Rate 04/03/18 2336 96     Resp 04/03/18 2336 18     Temp 04/03/18 2336 98.7 F (37.1 C)     Temp Source 04/03/18 2336 Oral     SpO2 04/03/18 2336 99 %     Weight 04/03/18 2335 275 lb (124.7 kg)     Height 04/03/18 2335 5\' 11"  (1.803 m)     Head Circumference --      Peak Flow --      Pain Score 04/03/18 2334 10     Pain Loc --      Pain Edu? --      Excl. in GC? --     Constitutional: Alert and oriented x4 uncomfortable appearing nontoxic no diaphoresis  Cardiovascular: Regular rate and rhythm Respiratory: Normal respiratory effort.  No retractions. GU: Abscess noted between her labia minora and majora on the left roughly 4 cm.  Exam and procedure chaperoned by female nurse. Neurologic:  Normal speech and language. No gross focal neurologic deficits are appreciated.  Skin:  Skin is warm, dry and intact. No rash noted.    ____________________________________________  LABS (all labs ordered are listed, but only abnormal results are displayed)  Labs Reviewed - No data to display   __________________________________________  EKG   ____________________________________________  RADIOLOGY   ____________________________________________   DIFFERENTIAL includes but not limited to  Vaginal abscess, labial abscess, Bartholin's abscess, Bartholin cyst, sexually  transmitted infection   PROCEDURES  Procedure(s) performed: Yes  .Marland KitchenIncision and Drainage Date/Time: 04/05/2018 10:26 AM Performed by: Merrily Brittle, MD Authorized by: Merrily Brittle, MD   Consent:    Consent obtained:  Verbal   Consent given by:  Patient   Risks discussed:  Bleeding, infection, incomplete drainage and pain   Alternatives discussed:  Alternative treatment, delayed treatment and observation Location:    Type:  Abscess    Size:  3 cm   Location:  Anogenital   Anogenital location:  Vulva Sedation:    Sedation type:  Anxiolysis Anesthesia (see MAR for exact dosages):    Anesthesia method:  Local infiltration   Local anesthetic:  Lidocaine 1% WITH epi Procedure type:    Complexity:  Complex Procedure details:    Incision types:  Single straight   Scalpel blade:  11   Wound management:  Probed and deloculated, irrigated with saline and extensive cleaning   Drainage:  Purulent   Drainage amount:  Moderate   Wound treatment:  Wound left open Post-procedure details:    Patient tolerance of procedure:  Tolerated well, no immediate complications    Critical Care performed: no  ____________________________________________   INITIAL IMPRESSION / ASSESSMENT AND PLAN / ED COURSE  Pertinent labs & imaging results that were available during my care of the patient were reviewed by me and considered in my medical decision making (see chart for details).   As part of my medical decision making, I reviewed the following data within the electronic MEDICAL RECORD NUMBER History obtained from family if available, nursing notes, old chart and ekg, as well as notes from prior ED visits.  Patient comes to the emergency department obviously uncomfortable appearing with an abscess between her labia minora and labia majora.  I spoke with on-call OB gynecologist Dr. Jean Rosenthal who recommended incision and drainage at the bedside.  I performed incision and drainage expressing a large amount of purulent material and the patient had near immediate improvement of her symptoms.  I will give her a short course of hydrocodone and OB gynecology follow-up.  Sitz bath twice a day until follow-up.      ____________________________________________   FINAL CLINICAL IMPRESSION(S) / ED DIAGNOSES  Final diagnoses:  Labial abscess      NEW MEDICATIONS STARTED DURING THIS VISIT:  Discharge Medication List as of 04/04/2018  1:04 AM     START taking these medications   Details  HYDROcodone-acetaminophen (NORCO) 5-325 MG tablet Take 1 tablet by mouth every 6 (six) hours as needed for up to 7 doses for severe pain., Starting Thu 04/04/2018, Print         Note:  This document was prepared using Dragon voice recognition software and may include unintentional dictation errors.      Merrily Brittle, MD 04/07/18 [phone]

## 2019-03-28 ENCOUNTER — Encounter: Payer: Self-pay | Admitting: Emergency Medicine

## 2019-03-28 ENCOUNTER — Emergency Department: Payer: Self-pay

## 2019-03-28 ENCOUNTER — Other Ambulatory Visit: Payer: Self-pay

## 2019-03-28 DIAGNOSIS — M542 Cervicalgia: Secondary | ICD-10-CM | POA: Insufficient documentation

## 2019-03-28 DIAGNOSIS — Z79899 Other long term (current) drug therapy: Secondary | ICD-10-CM | POA: Insufficient documentation

## 2019-03-28 DIAGNOSIS — Z87891 Personal history of nicotine dependence: Secondary | ICD-10-CM | POA: Insufficient documentation

## 2019-03-28 NOTE — ED Triage Notes (Signed)
Pt presents to ER from home reports she was on her back porch and whole porch fell down and pt fell with it reports hit her neck and haspain when she moves neck to right side pt walks with steady gait no distress noted

## 2019-03-29 ENCOUNTER — Emergency Department
Admission: EM | Admit: 2019-03-29 | Discharge: 2019-03-29 | Disposition: A | Discharge status: Home / Self Care | Payer: Self-pay | Attending: Emergency Medicine | Admitting: Emergency Medicine

## 2019-03-29 DIAGNOSIS — M542 Cervicalgia: Secondary | ICD-10-CM

## 2019-03-29 MED ORDER — CYCLOBENZAPRINE HCL 10 MG PO TABS
10.0000 mg | ORAL_TABLET | Freq: Once | ORAL | Status: AC
  Administered 2019-03-29: 10 mg via ORAL
  Filled 2019-03-29: qty 1

## 2019-03-29 MED ORDER — CYCLOBENZAPRINE HCL 10 MG PO TABS: 10.0000 mg | ORAL_TABLET | Freq: Once | ORAL | Status: DC

## 2019-03-29 MED ORDER — CYCLOBENZAPRINE HCL 10 MG PO TABS: 10.0000 mg | ORAL_TABLET | Freq: Three times a day (TID) | ORAL | 0 refills | Status: DC | PRN

## 2019-03-29 NOTE — ED Provider Notes (Signed)
[facility] Emergency Department Provider Note    First MD Initiated Contact with Patient 03/29/19 0354     (approximate)  I have reviewed the triage vital signs and the nursing notes.   HISTORY  Chief Complaint Fall and Neck Pain    HPI Tami Waters is a 28 y.o. female presents to the emergency department secondary to right side neck pain after patient states her porch fell down and she struck her neck at 2 AM yesterday morning.  Patient states that pain is worse with movement of the head to the right.  Patient states that current pain score is 10 out of 10        History reviewed. No pertinent past medical history.  There are no active problems to display for this patient.   History reviewed. No pertinent surgical history.  Prior to Admission medications   Medication Sig Start Date End Date Taking? Authorizing Provider  cephALEXin (KEFLEX) 500 MG capsule Take 1 capsule (500 mg total) by mouth 4 (four) times daily. 11/03/17   Jacqlyn Larsen, PA-C  HYDROcodone-acetaminophen (NORCO) 5-325 MG tablet Take 1 tablet by mouth every 6 (six) hours as needed for up to 7 doses for severe pain. 04/04/18   Darel Hong, MD  ibuprofen (ADVIL,MOTRIN) 800 MG tablet Take 1 tablet (800 mg total) every 8 (eight) hours as needed by mouth. 06/11/17   Menshew, Dannielle Karvonen, PA-C    Allergies Patient has no known allergies.  No family history on file.  Social History Social History   Tobacco Use  . Smoking status: Former Research scientist (life sciences)  . Smokeless tobacco: Never Used  Substance Use Topics  . Alcohol use: Yes  . Drug use: No    Review of Systems Constitutional: No fever/chills Eyes: No visual changes. ENT: No sore throat. Cardiovascular: Denies chest pain. Respiratory: Denies shortness of breath. Gastrointestinal: No abdominal pain.  No nausea, no vomiting.  No diarrhea.  No constipation. Genitourinary: Negative for dysuria. Musculoskeletal:  Positive for neck pain.  Negative for back pain. Integumentary: Negative for rash. Neurological: Negative for headaches, focal weakness or numbness.   ____________________________________________   PHYSICAL EXAM:  VITAL SIGNS: ED Triage Vitals  Enc Vitals Group     BP 03/28/19 2306 122/73     Pulse Rate 03/28/19 2306 83     Resp 03/28/19 2306 20     Temp 03/28/19 2306 98.6 F (37 C)     Temp Source 03/28/19 2306 Oral     SpO2 03/28/19 2306 98 %     Weight 03/28/19 2308 120.2 kg (265 lb)     Height 03/28/19 2308 1.803 m (5\' 11" )     Head Circumference --      Peak Flow --      Pain Score 03/28/19 2307 10     Pain Loc --      Pain Edu? --      Excl. in [place]? --     Constitutional: Alert and oriented.  Eyes: Conjunctivae are normal.  Head: Atraumatic. Mouth/Throat: Mucous membranes are moist. Neck: No stridor.  No meningeal signs.  No midline tenderness with palpation Cardiovascular: Normal rate, regular rhythm. Good peripheral circulation. Grossly normal heart sounds. Respiratory: Normal respiratory effort.  No retractions. Gastrointestinal: Soft and nontender. No distention.  Musculoskeletal: Pain with right trapezius muscle palpation.  No midline tenderness on the cervical spine with palpation. Neurologic:  Normal speech and language. No gross focal neurologic deficits are appreciated.  Skin:  Skin is warm, dry and intact. Psychiatric: Mood and affect are normal. Speech and behavior are normal.   ____________________________________________  RADIOLOGY I, Petros N Charnee Turnipseed, personally viewed and evaluated these images (plain radiographs) as part of my medical decision making, as well as reviewing the written report by the radiologist.  ED MD interpretation: Negative cervical spine radiographs per radiologist.  Official radiology report(s): Dg Cervical Spine Complete  Result Date: 03/29/2019 CLINICAL DATA:  Fall, pain EXAM: CERVICAL SPINE - COMPLETE 4+ VIEW COMPARISON:   None. FINDINGS: There is no evidence of cervical spine fracture or prevertebral soft tissue swelling. Alignment is normal. No other significant bone abnormalities are identified. IMPRESSION: Negative cervical spine radiographs. Electronically Signed   By: Charlett NoseKevin  Dover M.D.   On: 03/29/2019 00:14    _______________________ Procedures   ____________________________________________   INITIAL IMPRESSION / MDM / ASSESSMENT AND PLAN / ED COURSE  As part of my medical decision making, I reviewed the following data within the electronic MEDICAL RECORD NUMBER [age] year old female presented above-stated history and physical exam secondary to fall with resultant right neck pain.  Apical spine x-rays normal per radiologist.  Patient given Flexeril in the emergency department and will be prescribed the same for home.      ____________________________________________  FINAL CLINICAL IMPRESSION(S) / ED DIAGNOSES  Final diagnoses:  Neck pain     MEDICATIONS GIVEN DURING THIS VISIT:  Medications  cyclobenzaprine (FLEXERIL) tablet 10 mg (has no administration in time range)  cyclobenzaprine (FLEXERIL) tablet 10 mg (has no administration in time range)     ED Discharge Orders    None      *Please note:  Tami KluverStephanie S Holzmann was evaluated in Emergency Department on 03/29/2019 for the symptoms described in the history of present illness. She was evaluated in the context of the global COVID-19 pandemic, which necessitated consideration that the patient might be at risk for infection with the SARS-CoV-2 virus that causes COVID-19. Institutional protocols and algorithms that pertain to the evaluation of patients at risk for COVID-19 are in a state of rapid change based on information released by regulatory bodies including the CDC and federal and state organizations. These policies and algorithms were followed during the patient's care in the ED.  Some ED evaluations and interventions may be delayed as a  result of limited staffing during the pandemic.*  Note:  This document was prepared using Dragon voice recognition software and may include unintentional dictation errors.   Darci CurrentBrown, Derma N, MD 03/29/19 [phone]

## 2019-11-21 IMAGING — DX DG FINGER THUMB 2+V*R*
3 series · 3 of 3 positions shown · non-contrast
Comparison: None.

CLINICAL DATA: Lateral thumb laceration.

EXAM:
RIGHT THUMB 2+V

[x finger obl right]
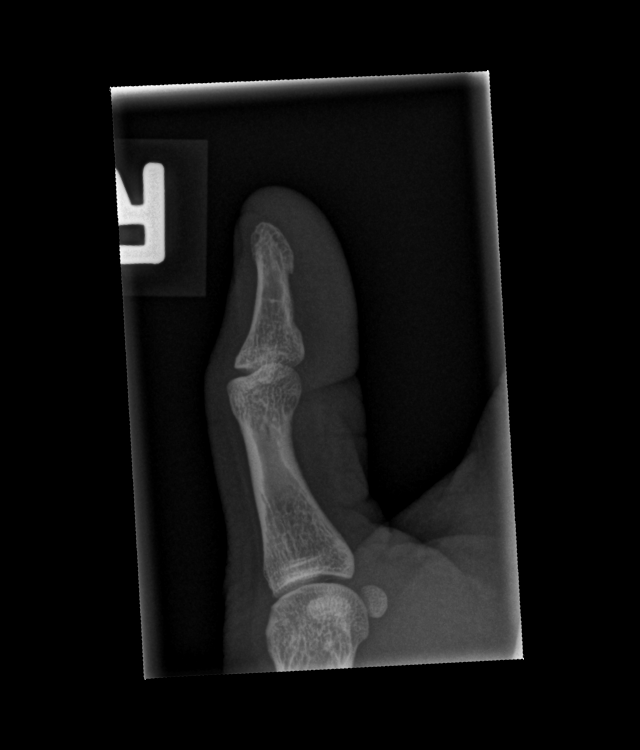

[x finger lat right]
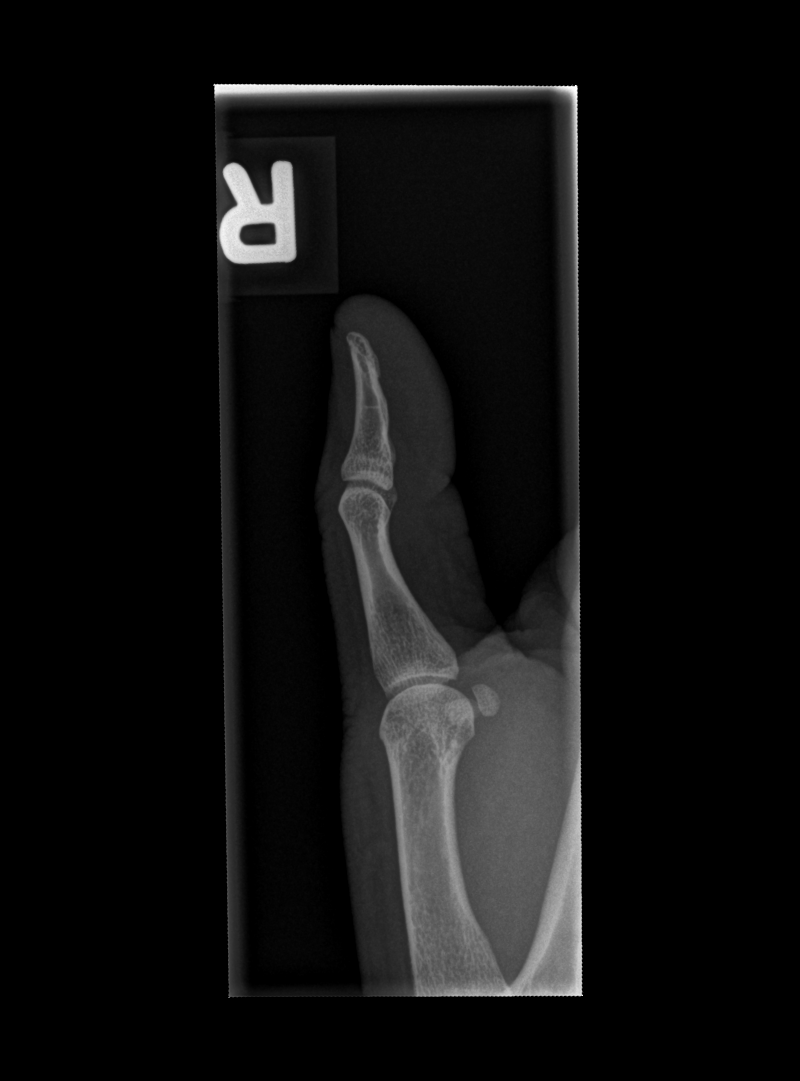

[x finger pa right]
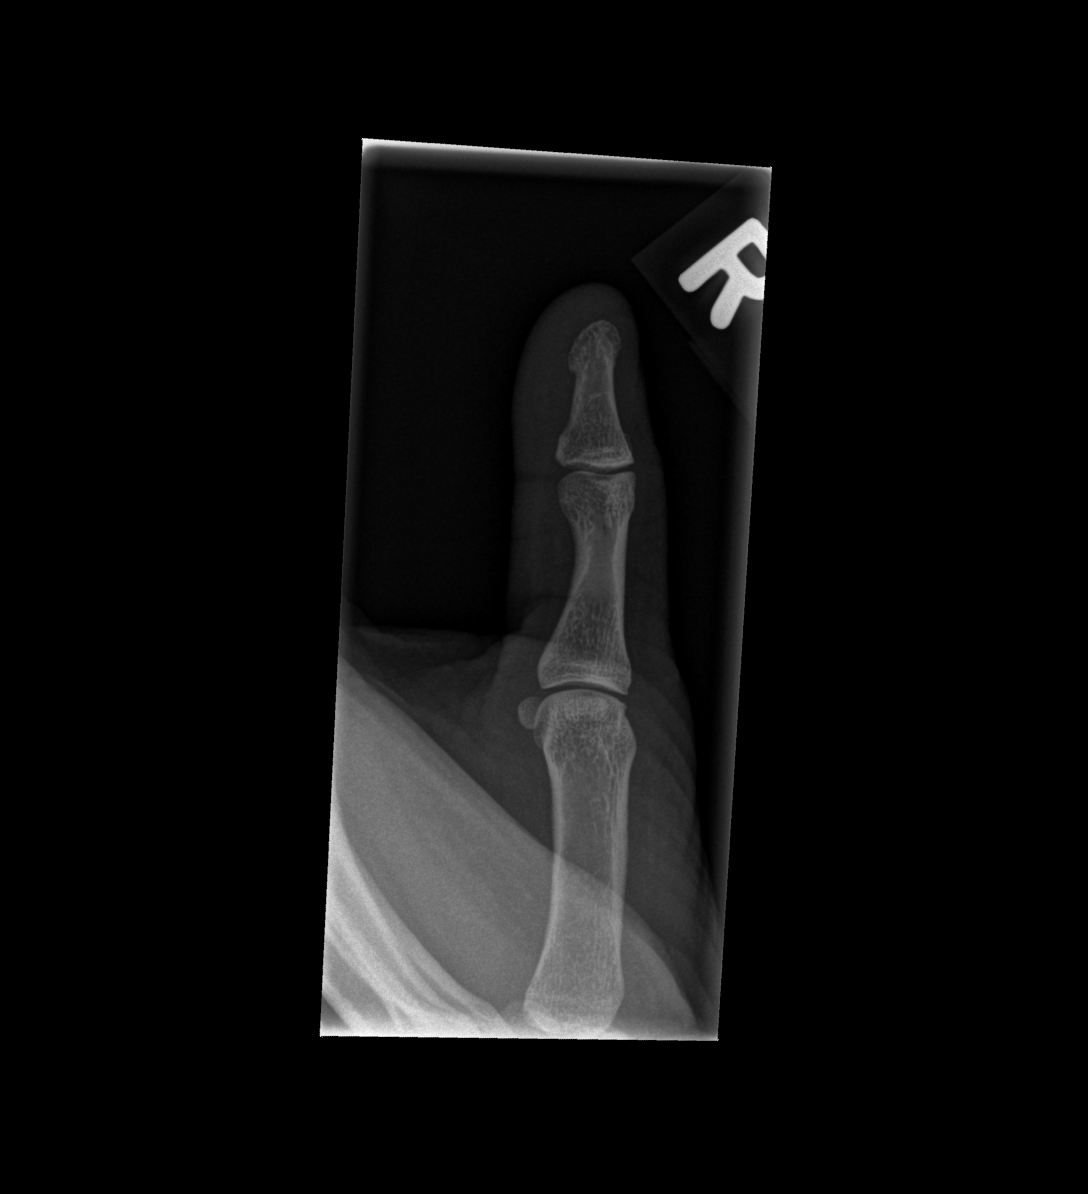

[3 of 3 positions shown; findings below may reference images not displayed]

FINDINGS: There is no evidence of fracture or dislocation. There is no
evidence of arthropathy or other focal bone abnormality. Soft
tissues are unremarkable
IMPRESSION: Negative.

## 2020-02-29 ENCOUNTER — Emergency Department: Admission: EM | Admit: 2020-02-29 | Discharge: 2020-02-29 | Discharge status: Against Medical Advice | Payer: Self-pay

## 2021-04-14 IMAGING — CR DG CERVICAL SPINE COMPLETE 4+V
1 series · 6 of 6 positions shown · non-contrast
Comparison: None.

CLINICAL DATA: Fall, pain

EXAM:
CERVICAL SPINE - COMPLETE 4+ VIEW

[Series 1: dg cervical spine complete · 0.14mm/px · 6 of 6 slices shown]
[im 1/6]
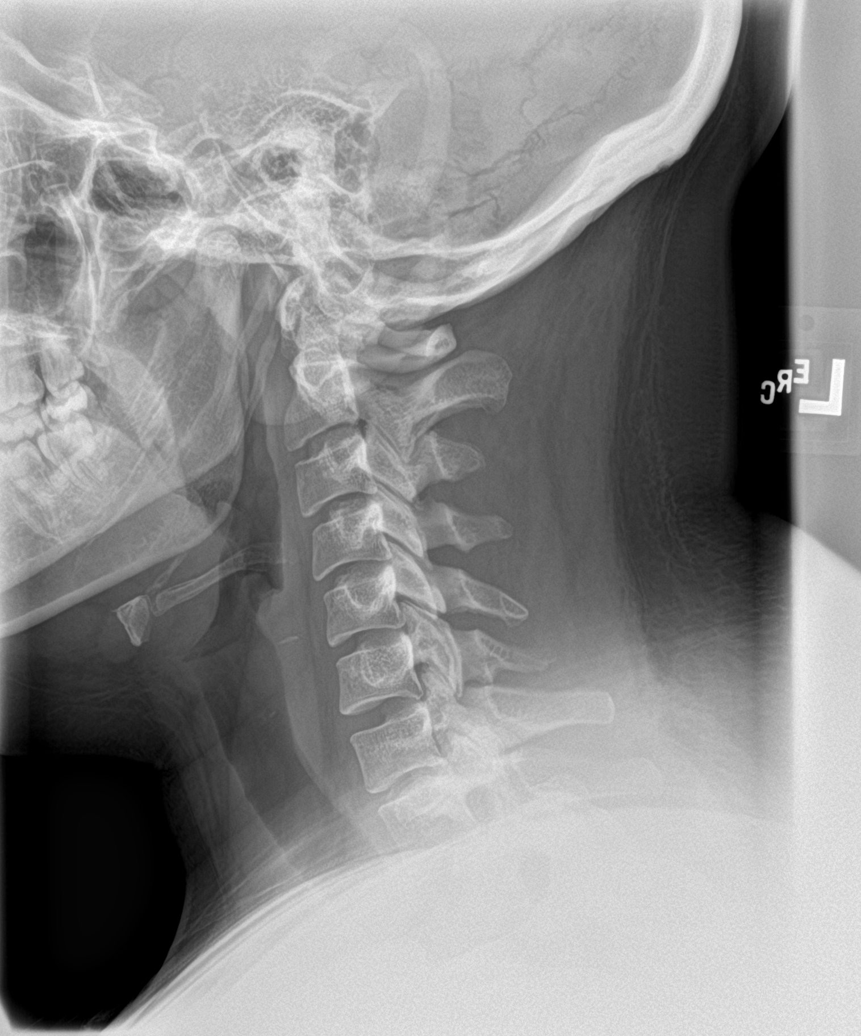
[im 2/6]
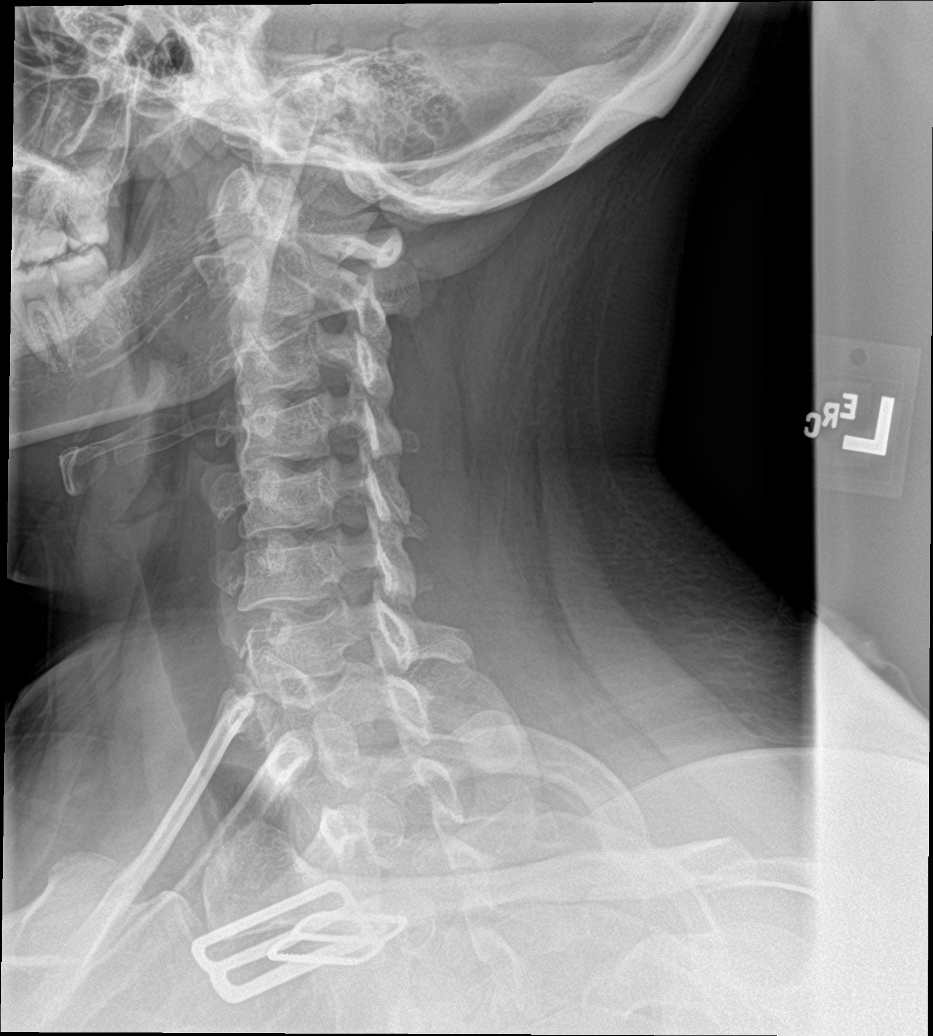
[im 3/6]
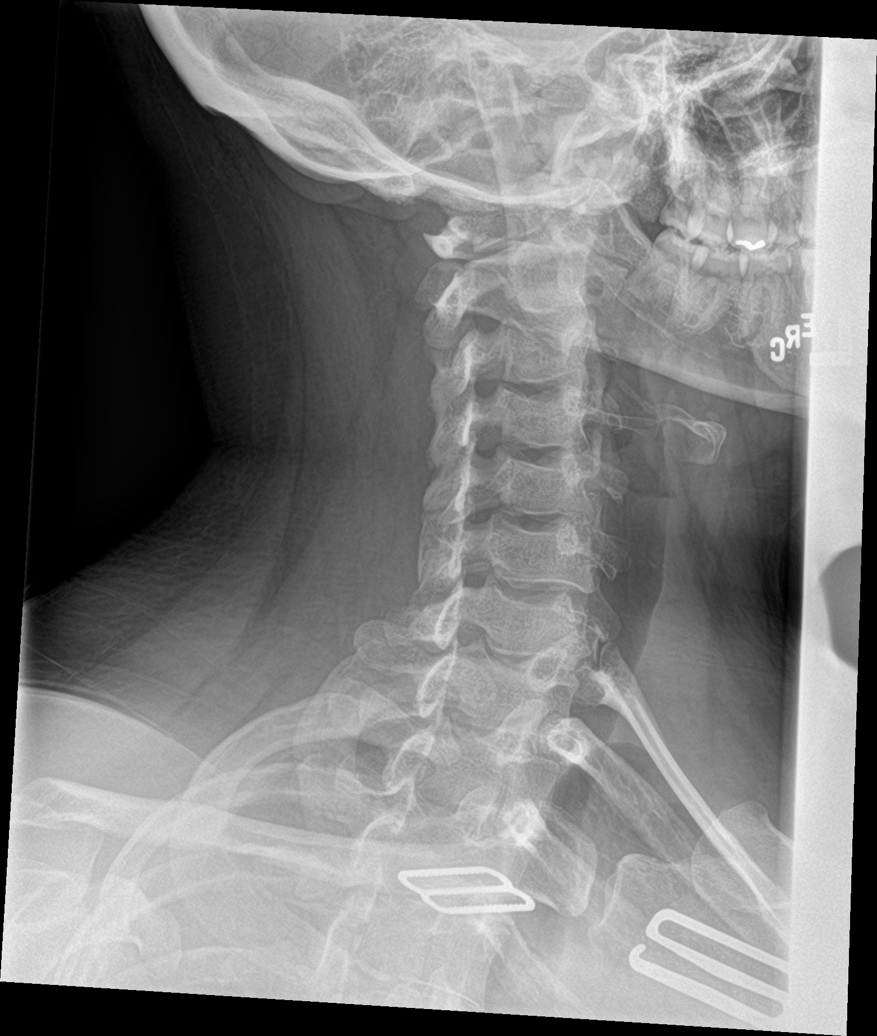
[im 4/6]
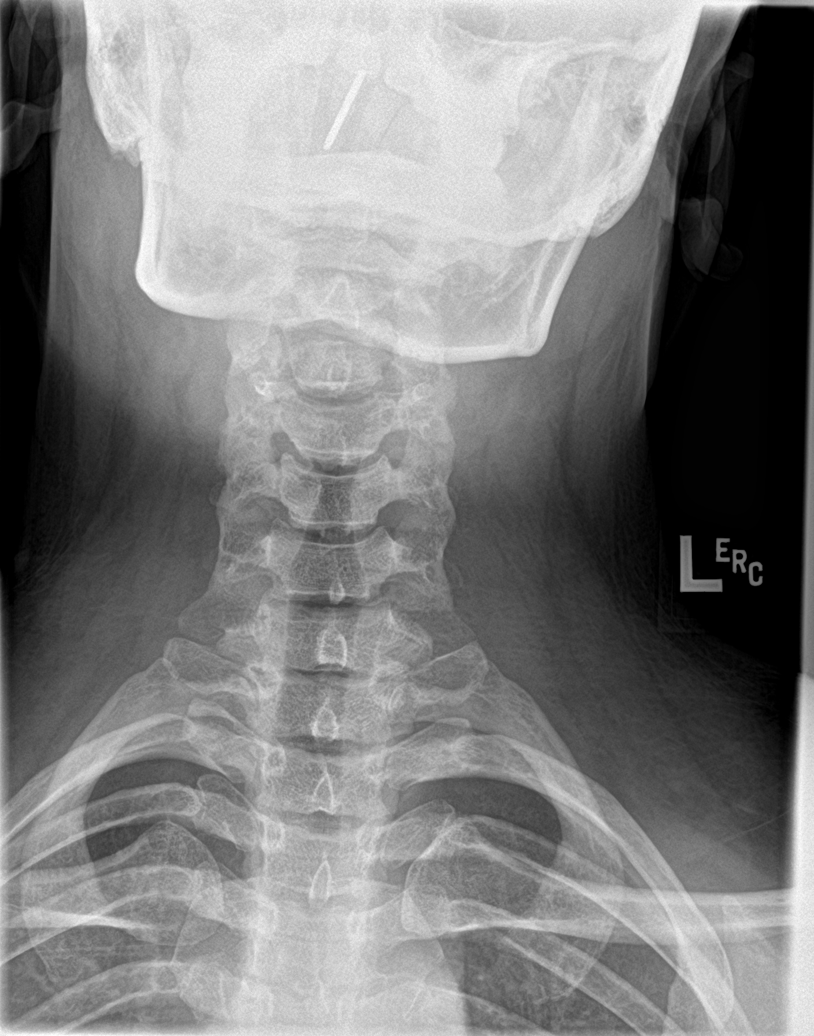
[im 5/6]
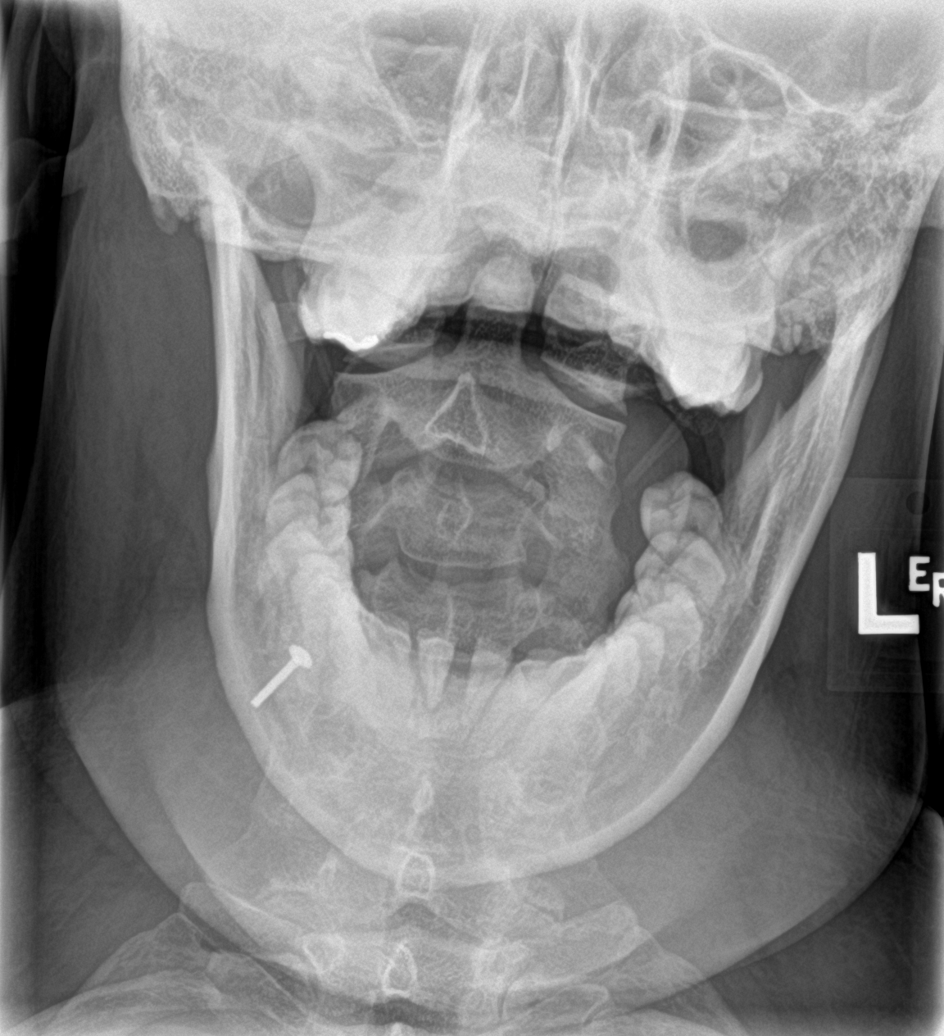
[im 6/6]
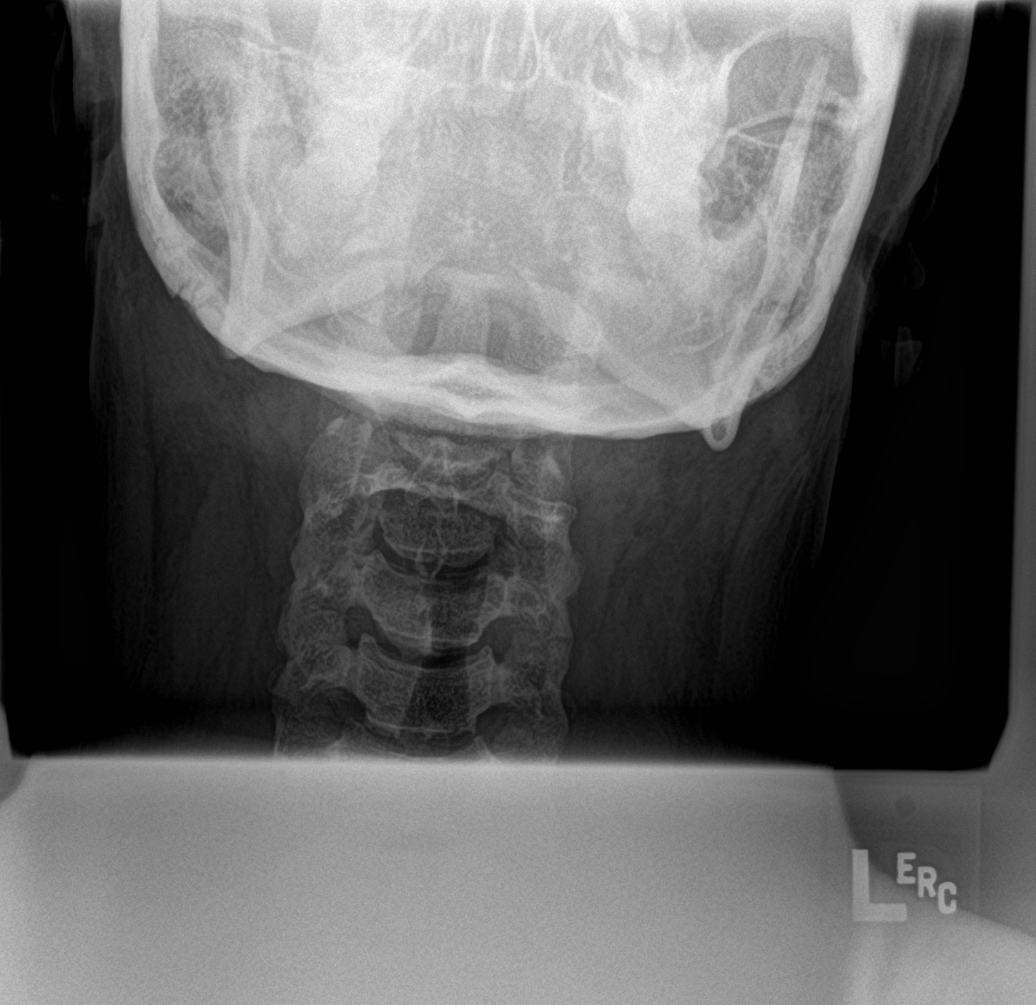

[6 of 6 positions shown; findings below may reference images not displayed]

FINDINGS: There is no evidence of cervical spine fracture or prevertebral soft
tissue swelling. Alignment is normal. No other significant bone
abnormalities are identified.
IMPRESSION: Negative cervical spine radiographs.

## 2021-11-03 DIAGNOSIS — J020 Streptococcal pharyngitis: Secondary | ICD-10-CM | POA: Insufficient documentation

## 2021-11-04 ENCOUNTER — Emergency Department
Admission: EM | Admit: 2021-11-04 | Discharge: 2021-11-04 | Disposition: A | Discharge status: Home / Self Care | Payer: Self-pay | Attending: Emergency Medicine | Admitting: Emergency Medicine

## 2021-11-04 ENCOUNTER — Encounter: Payer: Self-pay | Admitting: Emergency Medicine

## 2021-11-04 DIAGNOSIS — J020 Streptococcal pharyngitis: Secondary | ICD-10-CM

## 2021-11-04 DIAGNOSIS — J029 Acute pharyngitis, unspecified: Secondary | ICD-10-CM

## 2021-11-04 LAB — GROUP A STREP BY PCR: Group A Strep by PCR: DETECTED — AB

## 2021-11-04 MED ORDER — AMOXICILLIN-POT CLAVULANATE 875-125 MG PO TABS
1.0000 | ORAL_TABLET | Freq: Once | ORAL | Status: AC
  Administered 2021-11-04: 1 via ORAL
  Filled 2021-11-04: qty 1

## 2021-11-04 MED ORDER — AMOXICILLIN-POT CLAVULANATE 875-125 MG PO TABS: 1.0000 | ORAL_TABLET | Freq: Two times a day (BID) | ORAL | 0 refills | Status: AC

## 2021-11-04 NOTE — ED Provider Notes (Signed)
Patient ? ?[facility] ?Provider Note ? ? Event Date/Time  ? First MD Initiated Contact with Patient 11/04/21 0106   ?  (approximate) ?History  ?Sore Throat ? ?HPI ?Tami Waters is a 31 y.o. female with no stated past medical history who presents for complaints of sore throat that began yesterday.  Patient has been taking over-the-counter allergy medicine, ibuprofen, and cough drops with minimal relief in her symptoms.  Patient denies any recent travel or sick contacts.  Denies any shortness of breath, difficulty swallowing, chest pain, subjective fevers, or weakness/numbness/paresthesias in any extremity. ?Physical Exam  ?Triage Vital Signs: ?ED Triage Vitals  ?Enc Vitals Group  ?   BP 11/04/21 0003 (!) 151/97  ?   Pulse Rate 11/04/21 0003 94  ?   Resp 11/04/21 0003 20  ?   Temp 11/04/21 0003 99.1 ?F (37.3 ?C)  ?   Temp Source 11/04/21 0003 Oral  ?   SpO2 11/04/21 0003 98 %  ?   Weight 11/04/21 0003 285 lb (129.3 kg)  ?   Height 11/04/21 0003 5\' 9"  (1.753 m)  ?   Head Circumference --   ?   Peak Flow --   ?   Pain Score 11/04/21 0001 8  ?   Pain Loc --   ?   Pain Edu? --   ?   Excl. in GC? --   ? ?Most recent vital signs: ?Vitals:  ? 11/04/21 0003  ?BP: (!) 151/97  ?Pulse: 94  ?Resp: 20  ?Temp: 99.1 ?F (37.3 ?C)  ?SpO2: 98%  ? ?General: Awake, oriented x4. ?CV:  Good peripheral perfusion.  ?Resp:  Normal effort.  ?Abd:  No distention.  ?Other:  Obese middle-aged African-American female sitting in bed in no distress.  Significantly erythematous posterior oropharynx without active purulent drainage ?ED Results / Procedures / Treatments  ?Labs ?(all labs ordered are listed, but only abnormal results are displayed) ?Labs Reviewed  ?GROUP A STREP BY PCR - Abnormal; Notable for the following components:  ?    Result Value  ? Group A Strep by PCR DETECTED (*)   ? All other components within normal limits  ? ?PROCEDURES: ?Critical Care performed: No ?Procedures ?MEDICATIONS ORDERED IN  ED: ?Medications  ?amoxicillin-clavulanate (AUGMENTIN) 875-125 MG per tablet 1 tablet (has no administration in time range)  ? ?IMPRESSION / MDM / ASSESSMENT AND PLAN / ED COURSE  ?I reviewed the triage vital signs and the nursing notes. ?             ?               ? ?31 year old female presents for sore throat with a positive rapid group A strep ?No history of immunocompromise. Nontoxic appearance. Patient euvolemic with no trismus. No airway compromise. No change in voice, exudates, enlarged lymph nodes. Able to tolerate PO. Given History and Exam I have low suspicion for this presentation being caused by PTA, RPA, Ludwigs angina, Epiglottitis or Bacterial Tracheitis, EBV, acute HIV ?Rx: Augmentin x10 days ?Dispo: Discharge home ? ?  ?FINAL CLINICAL IMPRESSION(S) / ED DIAGNOSES  ? ?Final diagnoses:  ?Strep pharyngitis  ?Sore throat  ? ?Rx / DC Orders  ? ?ED Discharge Orders   ? ?      Ordered  ?  amoxicillin-clavulanate (AUGMENTIN) 875-125 MG tablet  2 times daily       ? 11/04/21 0118  ? ?  ?  ? ?  ? ?Note:  This document  was prepared using Conservation officer, historic buildings and may include unintentional dictation errors. ?  ?Merwyn Katos, MD ?11/04/21 0121 ? ?

## 2021-11-04 NOTE — ED Triage Notes (Signed)
Pt presents via POV with complaints of a sore throat that started yesterday. She notes taking OTC allergy medications, ibuprofen, and cough drops without improvement in her sx. Mild erythema to the posterior oral mucosa. Denies SOB  ?

## 2023-05-26 ENCOUNTER — Other Ambulatory Visit: Payer: Self-pay

## 2023-05-26 DIAGNOSIS — N764 Abscess of vulva: Secondary | ICD-10-CM | POA: Insufficient documentation

## 2023-05-26 NOTE — ED Triage Notes (Signed)
Pt reports possible abscess on left side of her inner labia. Pt tried using warm compresses at home but reports the abscess has become more inflamed and painful today. Pt reports no drainage from site at this time.

## 2023-05-27 ENCOUNTER — Emergency Department
Admission: EM | Admit: 2023-05-27 | Discharge: 2023-05-27 | Disposition: A | Discharge status: Home / Self Care | Payer: Self-pay | Attending: Emergency Medicine | Admitting: Emergency Medicine

## 2023-05-27 DIAGNOSIS — N764 Abscess of vulva: Secondary | ICD-10-CM

## 2023-05-27 MED ORDER — LIDOCAINE-PRILOCAINE 2.5-2.5 % EX CREA
TOPICAL_CREAM | Freq: Once | CUTANEOUS | Status: AC
  Filled 2023-05-27: qty 5

## 2023-05-27 MED ORDER — HYDROCODONE-ACETAMINOPHEN 5-325 MG PO TABS: 2.0000 | ORAL_TABLET | Freq: Three times a day (TID) | ORAL | 0 refills | Status: AC | PRN

## 2023-05-27 MED ORDER — IBUPROFEN 800 MG PO TABS: 800.0000 mg | ORAL_TABLET | Freq: Three times a day (TID) | ORAL | 0 refills | Status: AC | PRN

## 2023-05-27 MED ORDER — IBUPROFEN 800 MG PO TABS
800.0000 mg | ORAL_TABLET | Freq: Once | ORAL | Status: AC
  Administered 2023-05-27: 800 mg via ORAL
  Filled 2023-05-27: qty 1

## 2023-05-27 MED ORDER — ONDANSETRON 4 MG PO TBDP: 4.0000 mg | ORAL_TABLET | Freq: Four times a day (QID) | ORAL | 0 refills | Status: AC | PRN

## 2023-05-27 MED ORDER — LIDOCAINE HCL (PF) 1 % IJ SOLN
5.0000 mL | Freq: Once | INTRAMUSCULAR | Status: AC
  Administered 2023-05-27: 5 mL via INTRADERMAL
  Filled 2023-05-27: qty 5

## 2023-05-27 NOTE — ED Provider Notes (Signed)
[facility] Provider Note    Event Date/Time   First MD Initiated Contact with Patient 05/27/23 [phone]     (approximate)   History   Abscess   HPI  Tami Waters is a 32 y.o. female with no significant past medical history who presents to the emergency department with an abscess to the left labia.  No drainage.  No fever.  No history of diabetes.   History provided by patient.    No past medical history on file.  No past surgical history on file.  MEDICATIONS:  Prior to Admission medications   Medication Sig Start Date End Date Taking? Authorizing Provider  cyclobenzaprine (FLEXERIL) 10 MG tablet Take 1 tablet (10 mg total) by mouth 3 (three) times daily as needed. 03/29/19   Darci Current, MD  HYDROcodone-acetaminophen (NORCO) 5-325 MG tablet Take 1 tablet by mouth every 6 (six) hours as needed for up to 7 doses for severe pain. 04/04/18   Merrily Brittle, MD  ibuprofen (ADVIL,MOTRIN) 800 MG tablet Take 1 tablet (800 mg total) every 8 (eight) hours as needed by mouth. 06/11/17   Menshew, Charlesetta Ivory, PA-C    Physical Exam   Triage Vital Signs: ED Triage Vitals  Encounter Vitals Group     BP 05/26/23 2212 (!) 133/95     Systolic BP Percentile --      Diastolic BP Percentile --      Pulse Rate 05/26/23 2212 76     Resp 05/26/23 2212 16     Temp 05/26/23 2212 98.4 F (36.9 C)     Temp Source 05/26/23 2212 Oral     SpO2 05/26/23 2212 100 %     Weight 05/26/23 2213 285 lb (129.3 kg)     Height 05/26/23 2213 5\' 9"  (1.753 m)     Head Circumference --      Peak Flow --      Pain Score 05/26/23 2213 10     Pain Loc --      Pain Education --      Exclude from Growth Chart --     Most recent vital signs: Vitals:   05/27/23 0306 05/27/23 0308  BP: (!) 133/58 (!) 129/95  Pulse: 83   Resp: 17   Temp: 98.5 F (36.9 C)   SpO2: 98%      CONSTITUTIONAL: Alert and responds appropriately to questions. Well-appearing;  well-nourished HEAD: Normocephalic, atraumatic EYES: Conjunctivae clear, pupils appear equal ENT: normal nose; moist mucous membranes NECK: Normal range of motion CARD: Regular rate and rhythm RESP: Normal chest excursion without splinting or tachypnea; no hypoxia or respiratory distress, speaking full sentences ABD/GI: non-distended GU: Pain, swelling and tenderness to the left labia minora with fluctuance but no surrounding redness, induration, crepitus or drainage EXT: Normal ROM in all joints, no major deformities noted SKIN: Normal color for age and race, no rashes on exposed skin NEURO: Moves all extremities equally, normal speech, no facial asymmetry noted PSYCH: The patient's mood and manner are appropriate. Grooming and personal hygiene are appropriate.  ED Results / Procedures / Treatments   LABS: (all labs ordered are listed, but only abnormal results are displayed) Labs Reviewed - No data to display   EKG:  EKG Interpretation Date/Time:    Ventricular Rate:    PR Interval:    QRS Duration:    QT Interval:    QTC Calculation:   R Axis:      Text Interpretation:  RADIOLOGY: My personal review and interpretation of imaging:    I have personally reviewed all radiology reports. No results found.   PROCEDURES:  Critical Care performed: No   INCISION AND DRAINAGE Performed by: Baxter Hire Erasmus Bistline Consent: Verbal consent obtained. Risks and benefits: risks, benefits and alternatives were discussed Type: abscess  Body area: left labia  Anesthesia: local infiltration  Incision was made with a scalpel.  Local anesthetic: lidocaine 1% without epinephrine  Anesthetic total: 3 ml  Complexity: complex  Drainage: purulent  Drainage amount: large  Packing material: none  Patient tolerance: Patient tolerated the procedure well with no immediate complications.     Procedures    IMPRESSION / MDM / ASSESSMENT AND PLAN / ED COURSE  I  reviewed the triage vital signs and the nursing notes.   Patient here with left labial abscess.     DIFFERENTIAL DIAGNOSIS (includes but not limited to):   Abscess, Bartholin cyst, no signs of cellulitis, doubt Fournier's gangrene  Patient's presentation is most consistent with acute complicated illness / injury requiring diagnostic workup.  PLAN: Will perform incision and drainage.  Will provide pain medication.   MEDICATIONS GIVEN IN ED: Medications  lidocaine-prilocaine (EMLA) cream ( Topical Given 05/27/23 0444)  lidocaine (PF) (XYLOCAINE) 1 % injection 5 mL (5 mLs Intradermal Given 05/27/23 0522)  ibuprofen (ADVIL) tablet 800 mg (800 mg Oral Given 05/27/23 0444)     ED COURSE: Patient tolerate incision and drainage with some difficulty.  Recommended continuing warm compresses, soaks.  No indication for antibiotics.  Will discharge with pain medication.  Given PCP follow-up.   At this time, I do not feel there is any life-threatening condition present. I reviewed all nursing notes, vitals, pertinent previous records.  All lab and urine results, EKGs, imaging ordered have been independently reviewed and interpreted by myself.  I reviewed all available radiology reports from any imaging ordered this visit.  Based on my assessment, I feel the patient is safe to be discharged home without further emergent workup and can continue workup as an outpatient as needed. Discussed all findings, treatment plan as well as usual and customary return precautions.  They verbalize understanding and are comfortable with this plan.  Outpatient follow-up has been provided as needed.  All questions have been answered.    CONSULTS:  none   OUTSIDE RECORDS REVIEWED: Reviewed last orthopedic note at [facility] in 2017.     FINAL CLINICAL IMPRESSION(S) / ED DIAGNOSES   Final diagnoses:  Left genital labial abscess     Rx / DC Orders   ED Discharge Orders          Ordered    Ambulatory Referral to  Primary Care (Establish Care)        05/27/23 0503    HYDROcodone-acetaminophen (NORCO/VICODIN) 5-325 MG tablet  Every 8 hours PRN        05/27/23 0504    ondansetron (ZOFRAN-ODT) 4 MG disintegrating tablet  Every 6 hours PRN        05/27/23 0504    ibuprofen (ADVIL) 800 MG tablet  Every 8 hours PRN        05/27/23 0504             Note:  This document was prepared using Dragon voice recognition software and may include unintentional dictation errors.   Kaiel Weide, Layla Maw, DO 05/27/23 [phone]

## 2023-05-27 NOTE — Discharge Instructions (Signed)
